# Patient Record
Sex: Female | Born: 1955
Health system: Southern US, Community
[De-identification: ages and names within clinical notes are randomized; demographics above are authoritative.]

---

## 2011-06-22 ENCOUNTER — Other Ambulatory Visit: Payer: Self-pay | Admitting: Family Medicine

## 2011-06-22 DIAGNOSIS — Z1231 Encounter for screening mammogram for malignant neoplasm of breast: Secondary | ICD-10-CM

## 2011-07-01 ENCOUNTER — Ambulatory Visit
Admission: RE | Admit: 2011-07-01 | Discharge: 2011-07-01 | Disposition: A | Discharge status: Home / Self Care | Payer: BC Managed Care – PPO | Source: Ambulatory Visit | Attending: Family Medicine | Admitting: Family Medicine

## 2011-07-01 DIAGNOSIS — Z1231 Encounter for screening mammogram for malignant neoplasm of breast: Secondary | ICD-10-CM

## 2011-07-13 ENCOUNTER — Other Ambulatory Visit (HOSPITAL_COMMUNITY)
Admission: RE | Admit: 2011-07-13 | Discharge: 2011-07-13 | Disposition: A | Discharge status: Home / Self Care | Payer: BC Managed Care – PPO | Source: Ambulatory Visit | Attending: Family Medicine | Admitting: Family Medicine

## 2011-07-13 ENCOUNTER — Other Ambulatory Visit: Payer: Self-pay | Admitting: Family Medicine

## 2011-07-13 DIAGNOSIS — Z124 Encounter for screening for malignant neoplasm of cervix: Secondary | ICD-10-CM | POA: Insufficient documentation

## 2011-07-13 DIAGNOSIS — Z1159 Encounter for screening for other viral diseases: Secondary | ICD-10-CM | POA: Insufficient documentation

## 2012-07-23 ENCOUNTER — Other Ambulatory Visit: Payer: Self-pay | Admitting: Family Medicine

## 2012-07-23 DIAGNOSIS — Z1231 Encounter for screening mammogram for malignant neoplasm of breast: Secondary | ICD-10-CM

## 2012-08-27 ENCOUNTER — Ambulatory Visit
Admission: RE | Admit: 2012-08-27 | Discharge: 2012-08-27 | Disposition: A | Discharge status: Home / Self Care | Payer: BC Managed Care – PPO | Source: Ambulatory Visit | Attending: Family Medicine | Admitting: Family Medicine

## 2012-08-27 DIAGNOSIS — Z1231 Encounter for screening mammogram for malignant neoplasm of breast: Secondary | ICD-10-CM

## 2013-09-25 ENCOUNTER — Other Ambulatory Visit: Payer: Self-pay

## 2013-09-25 DIAGNOSIS — Z1231 Encounter for screening mammogram for malignant neoplasm of breast: Secondary | ICD-10-CM

## 2013-10-11 ENCOUNTER — Ambulatory Visit
Admission: RE | Admit: 2013-10-11 | Discharge: 2013-10-11 | Disposition: A | Discharge status: Home / Self Care | Payer: BC Managed Care – PPO | Source: Ambulatory Visit

## 2013-10-11 DIAGNOSIS — Z1231 Encounter for screening mammogram for malignant neoplasm of breast: Secondary | ICD-10-CM

## 2014-04-21 ENCOUNTER — Other Ambulatory Visit: Payer: Self-pay | Admitting: Family Medicine

## 2014-04-21 ENCOUNTER — Other Ambulatory Visit (HOSPITAL_COMMUNITY)
Admission: RE | Admit: 2014-04-21 | Discharge: 2014-04-21 | Disposition: A | Discharge status: Home / Self Care | Payer: BC Managed Care – PPO | Source: Ambulatory Visit | Attending: Family Medicine | Admitting: Family Medicine

## 2014-04-21 DIAGNOSIS — Z1151 Encounter for screening for human papillomavirus (HPV): Secondary | ICD-10-CM | POA: Insufficient documentation

## 2014-04-21 DIAGNOSIS — Z124 Encounter for screening for malignant neoplasm of cervix: Secondary | ICD-10-CM | POA: Insufficient documentation

## 2014-04-23 LAB — CYTOLOGY - PAP

## 2014-11-26 ENCOUNTER — Other Ambulatory Visit: Payer: Self-pay

## 2014-11-26 DIAGNOSIS — Z1231 Encounter for screening mammogram for malignant neoplasm of breast: Secondary | ICD-10-CM

## 2014-12-03 ENCOUNTER — Ambulatory Visit
Admission: RE | Admit: 2014-12-03 | Discharge: 2014-12-03 | Disposition: A | Discharge status: Home / Self Care | Payer: BLUE CROSS/BLUE SHIELD | Source: Ambulatory Visit

## 2014-12-03 DIAGNOSIS — Z1231 Encounter for screening mammogram for malignant neoplasm of breast: Secondary | ICD-10-CM

## 2014-12-04 ENCOUNTER — Other Ambulatory Visit: Payer: Self-pay | Admitting: Family Medicine

## 2014-12-04 DIAGNOSIS — R928 Other abnormal and inconclusive findings on diagnostic imaging of breast: Secondary | ICD-10-CM

## 2014-12-15 ENCOUNTER — Ambulatory Visit
Admission: RE | Admit: 2014-12-15 | Discharge: 2014-12-15 | Disposition: A | Discharge status: Home / Self Care | Payer: BLUE CROSS/BLUE SHIELD | Source: Ambulatory Visit | Attending: Family Medicine | Admitting: Family Medicine

## 2014-12-15 DIAGNOSIS — R928 Other abnormal and inconclusive findings on diagnostic imaging of breast: Secondary | ICD-10-CM

## 2016-03-15 ENCOUNTER — Other Ambulatory Visit: Payer: Self-pay | Admitting: Family Medicine

## 2016-03-15 DIAGNOSIS — Z1231 Encounter for screening mammogram for malignant neoplasm of breast: Secondary | ICD-10-CM

## 2016-03-18 ENCOUNTER — Ambulatory Visit
Admission: RE | Admit: 2016-03-18 | Discharge: 2016-03-18 | Disposition: A | Discharge status: Home / Self Care | Payer: BLUE CROSS/BLUE SHIELD | Source: Ambulatory Visit | Attending: Family Medicine | Admitting: Family Medicine

## 2016-03-18 DIAGNOSIS — Z1231 Encounter for screening mammogram for malignant neoplasm of breast: Secondary | ICD-10-CM

## 2016-03-22 ENCOUNTER — Other Ambulatory Visit: Payer: Self-pay | Admitting: Family Medicine

## 2016-03-22 DIAGNOSIS — R928 Other abnormal and inconclusive findings on diagnostic imaging of breast: Secondary | ICD-10-CM

## 2016-04-20 ENCOUNTER — Ambulatory Visit
Admission: RE | Admit: 2016-04-20 | Discharge: 2016-04-20 | Disposition: A | Discharge status: Home / Self Care | Payer: BLUE CROSS/BLUE SHIELD | Source: Ambulatory Visit | Attending: Family Medicine | Admitting: Family Medicine

## 2016-04-20 DIAGNOSIS — R928 Other abnormal and inconclusive findings on diagnostic imaging of breast: Secondary | ICD-10-CM

## 2016-04-20 DIAGNOSIS — R922 Inconclusive mammogram: Secondary | ICD-10-CM | POA: Diagnosis not present

## 2016-05-03 DIAGNOSIS — Z131 Encounter for screening for diabetes mellitus: Secondary | ICD-10-CM | POA: Diagnosis not present

## 2016-05-03 DIAGNOSIS — Z0000 Encounter for general adult medical examination without abnormal findings: Secondary | ICD-10-CM | POA: Diagnosis not present

## 2016-05-03 DIAGNOSIS — Z1159 Encounter for screening for other viral diseases: Secondary | ICD-10-CM | POA: Diagnosis not present

## 2016-05-03 DIAGNOSIS — Z13220 Encounter for screening for lipoid disorders: Secondary | ICD-10-CM | POA: Diagnosis not present

## 2017-01-25 DIAGNOSIS — S20461A Insect bite (nonvenomous) of right back wall of thorax, initial encounter: Secondary | ICD-10-CM | POA: Diagnosis not present

## 2017-03-15 ENCOUNTER — Other Ambulatory Visit (HOSPITAL_COMMUNITY)
Admission: RE | Admit: 2017-03-15 | Discharge: 2017-03-15 | Disposition: A | Discharge status: Home / Self Care | Payer: BLUE CROSS/BLUE SHIELD | Source: Ambulatory Visit | Attending: Family Medicine | Admitting: Family Medicine

## 2017-03-15 ENCOUNTER — Other Ambulatory Visit: Payer: Self-pay | Admitting: Family Medicine

## 2017-03-15 DIAGNOSIS — Z131 Encounter for screening for diabetes mellitus: Secondary | ICD-10-CM | POA: Diagnosis not present

## 2017-03-15 DIAGNOSIS — Z13220 Encounter for screening for lipoid disorders: Secondary | ICD-10-CM | POA: Diagnosis not present

## 2017-03-15 DIAGNOSIS — Z124 Encounter for screening for malignant neoplasm of cervix: Secondary | ICD-10-CM | POA: Diagnosis not present

## 2017-03-15 DIAGNOSIS — Z0000 Encounter for general adult medical examination without abnormal findings: Secondary | ICD-10-CM | POA: Diagnosis not present

## 2017-03-20 LAB — CYTOLOGY - PAP
Diagnosis: NEGATIVE
HPV: NOT DETECTED

## 2017-05-15 ENCOUNTER — Other Ambulatory Visit: Payer: Self-pay | Admitting: Family Medicine

## 2017-05-15 DIAGNOSIS — Z1231 Encounter for screening mammogram for malignant neoplasm of breast: Secondary | ICD-10-CM

## 2017-05-19 ENCOUNTER — Ambulatory Visit
Admission: RE | Admit: 2017-05-19 | Discharge: 2017-05-19 | Disposition: A | Discharge status: Home / Self Care | Payer: BLUE CROSS/BLUE SHIELD | Source: Ambulatory Visit | Attending: Family Medicine | Admitting: Family Medicine

## 2017-05-19 DIAGNOSIS — Z1231 Encounter for screening mammogram for malignant neoplasm of breast: Secondary | ICD-10-CM | POA: Diagnosis not present

## 2018-03-26 DIAGNOSIS — Z0000 Encounter for general adult medical examination without abnormal findings: Secondary | ICD-10-CM | POA: Diagnosis not present

## 2018-03-26 DIAGNOSIS — Z13220 Encounter for screening for lipoid disorders: Secondary | ICD-10-CM | POA: Diagnosis not present

## 2018-03-26 DIAGNOSIS — Z131 Encounter for screening for diabetes mellitus: Secondary | ICD-10-CM | POA: Diagnosis not present

## 2018-07-04 ENCOUNTER — Other Ambulatory Visit: Payer: Self-pay | Admitting: Family Medicine

## 2018-07-04 DIAGNOSIS — Z1231 Encounter for screening mammogram for malignant neoplasm of breast: Secondary | ICD-10-CM

## 2018-08-17 ENCOUNTER — Ambulatory Visit
Admission: RE | Admit: 2018-08-17 | Discharge: 2018-08-17 | Disposition: A | Discharge status: Home / Self Care | Payer: BLUE CROSS/BLUE SHIELD | Source: Ambulatory Visit | Attending: Family Medicine | Admitting: Family Medicine

## 2018-08-17 DIAGNOSIS — Z1231 Encounter for screening mammogram for malignant neoplasm of breast: Secondary | ICD-10-CM | POA: Diagnosis not present

## 2019-04-08 DIAGNOSIS — Z23 Encounter for immunization: Secondary | ICD-10-CM | POA: Diagnosis not present

## 2019-04-08 DIAGNOSIS — Z131 Encounter for screening for diabetes mellitus: Secondary | ICD-10-CM | POA: Diagnosis not present

## 2019-04-08 DIAGNOSIS — Z0000 Encounter for general adult medical examination without abnormal findings: Secondary | ICD-10-CM | POA: Diagnosis not present

## 2019-04-08 DIAGNOSIS — E7800 Pure hypercholesterolemia, unspecified: Secondary | ICD-10-CM | POA: Diagnosis not present

## 2020-04-14 ENCOUNTER — Other Ambulatory Visit: Payer: Self-pay | Admitting: Family Medicine

## 2020-04-14 DIAGNOSIS — Z1231 Encounter for screening mammogram for malignant neoplasm of breast: Secondary | ICD-10-CM

## 2020-04-15 ENCOUNTER — Other Ambulatory Visit: Payer: Self-pay | Admitting: Family Medicine

## 2020-04-15 ENCOUNTER — Other Ambulatory Visit (HOSPITAL_COMMUNITY)
Admission: RE | Admit: 2020-04-15 | Discharge: 2020-04-15 | Disposition: A | Discharge status: Home / Self Care | Payer: BC Managed Care – PPO | Source: Ambulatory Visit | Attending: Family Medicine | Admitting: Family Medicine

## 2020-04-15 DIAGNOSIS — Z124 Encounter for screening for malignant neoplasm of cervix: Secondary | ICD-10-CM | POA: Diagnosis not present

## 2020-04-15 DIAGNOSIS — Z13220 Encounter for screening for lipoid disorders: Secondary | ICD-10-CM | POA: Diagnosis not present

## 2020-04-15 DIAGNOSIS — Z0000 Encounter for general adult medical examination without abnormal findings: Secondary | ICD-10-CM | POA: Diagnosis not present

## 2020-04-15 DIAGNOSIS — Z131 Encounter for screening for diabetes mellitus: Secondary | ICD-10-CM | POA: Diagnosis not present

## 2020-04-16 ENCOUNTER — Other Ambulatory Visit: Payer: Self-pay

## 2020-04-16 ENCOUNTER — Ambulatory Visit
Admission: RE | Admit: 2020-04-16 | Discharge: 2020-04-16 | Disposition: A | Discharge status: Home / Self Care | Payer: BLUE CROSS/BLUE SHIELD | Source: Ambulatory Visit | Attending: Family Medicine | Admitting: Family Medicine

## 2020-04-16 DIAGNOSIS — Z1231 Encounter for screening mammogram for malignant neoplasm of breast: Secondary | ICD-10-CM

## 2020-04-20 LAB — CYTOLOGY - PAP
Comment: NEGATIVE
Diagnosis: NEGATIVE
High risk HPV: NEGATIVE

## 2020-05-18 DIAGNOSIS — C44719 Basal cell carcinoma of skin of left lower limb, including hip: Secondary | ICD-10-CM | POA: Diagnosis not present

## 2020-05-18 DIAGNOSIS — D485 Neoplasm of uncertain behavior of skin: Secondary | ICD-10-CM | POA: Diagnosis not present

## 2020-05-18 DIAGNOSIS — L570 Actinic keratosis: Secondary | ICD-10-CM | POA: Diagnosis not present

## 2020-05-18 DIAGNOSIS — L821 Other seborrheic keratosis: Secondary | ICD-10-CM | POA: Diagnosis not present

## 2020-05-18 DIAGNOSIS — D225 Melanocytic nevi of trunk: Secondary | ICD-10-CM | POA: Diagnosis not present

## 2020-05-18 DIAGNOSIS — L578 Other skin changes due to chronic exposure to nonionizing radiation: Secondary | ICD-10-CM | POA: Diagnosis not present

## 2020-05-18 DIAGNOSIS — B353 Tinea pedis: Secondary | ICD-10-CM | POA: Diagnosis not present

## 2020-07-15 DIAGNOSIS — C44719 Basal cell carcinoma of skin of left lower limb, including hip: Secondary | ICD-10-CM | POA: Diagnosis not present

## 2021-03-17 DIAGNOSIS — H3561 Retinal hemorrhage, right eye: Secondary | ICD-10-CM | POA: Diagnosis not present

## 2021-04-28 DIAGNOSIS — Z0000 Encounter for general adult medical examination without abnormal findings: Secondary | ICD-10-CM | POA: Diagnosis not present

## 2021-04-28 DIAGNOSIS — Z131 Encounter for screening for diabetes mellitus: Secondary | ICD-10-CM | POA: Diagnosis not present

## 2021-04-28 DIAGNOSIS — Z23 Encounter for immunization: Secondary | ICD-10-CM | POA: Diagnosis not present

## 2021-04-28 DIAGNOSIS — Z13220 Encounter for screening for lipoid disorders: Secondary | ICD-10-CM | POA: Diagnosis not present

## 2021-04-30 ENCOUNTER — Other Ambulatory Visit: Payer: Self-pay | Admitting: Family Medicine

## 2021-04-30 DIAGNOSIS — Z1231 Encounter for screening mammogram for malignant neoplasm of breast: Secondary | ICD-10-CM

## 2021-05-24 DIAGNOSIS — Z808 Family history of malignant neoplasm of other organs or systems: Secondary | ICD-10-CM | POA: Diagnosis not present

## 2021-05-24 DIAGNOSIS — Z85828 Personal history of other malignant neoplasm of skin: Secondary | ICD-10-CM | POA: Diagnosis not present

## 2021-05-24 DIAGNOSIS — L821 Other seborrheic keratosis: Secondary | ICD-10-CM | POA: Diagnosis not present

## 2021-05-24 DIAGNOSIS — L570 Actinic keratosis: Secondary | ICD-10-CM | POA: Diagnosis not present

## 2021-05-24 DIAGNOSIS — L820 Inflamed seborrheic keratosis: Secondary | ICD-10-CM | POA: Diagnosis not present

## 2021-06-02 ENCOUNTER — Other Ambulatory Visit: Payer: Self-pay

## 2021-06-02 ENCOUNTER — Ambulatory Visit
Admission: RE | Admit: 2021-06-02 | Discharge: 2021-06-02 | Disposition: A | Discharge status: Home / Self Care | Payer: BC Managed Care – PPO | Source: Ambulatory Visit | Attending: Family Medicine | Admitting: Family Medicine

## 2021-06-02 DIAGNOSIS — Z1231 Encounter for screening mammogram for malignant neoplasm of breast: Secondary | ICD-10-CM | POA: Diagnosis not present

## 2021-06-30 DIAGNOSIS — H3561 Retinal hemorrhage, right eye: Secondary | ICD-10-CM | POA: Diagnosis not present

## 2021-07-27 ENCOUNTER — Other Ambulatory Visit (HOSPITAL_BASED_OUTPATIENT_CLINIC_OR_DEPARTMENT_OTHER): Payer: Self-pay | Admitting: Family Medicine

## 2021-07-27 DIAGNOSIS — H3561 Retinal hemorrhage, right eye: Secondary | ICD-10-CM

## 2021-08-06 ENCOUNTER — Other Ambulatory Visit (HOSPITAL_COMMUNITY): Payer: Self-pay | Admitting: Family Medicine

## 2021-08-06 ENCOUNTER — Ambulatory Visit (HOSPITAL_COMMUNITY)
Admission: RE | Admit: 2021-08-06 | Discharge: 2021-08-06 | Disposition: A | Discharge status: Home / Self Care | Payer: BC Managed Care – PPO | Source: Ambulatory Visit | Attending: Internal Medicine | Admitting: Internal Medicine

## 2021-08-06 ENCOUNTER — Other Ambulatory Visit: Payer: Self-pay

## 2021-08-06 DIAGNOSIS — H3561 Retinal hemorrhage, right eye: Secondary | ICD-10-CM

## 2021-08-24 ENCOUNTER — Other Ambulatory Visit: Payer: Self-pay

## 2021-08-24 ENCOUNTER — Ambulatory Visit: Payer: BC Managed Care – PPO | Admitting: Internal Medicine

## 2021-08-24 ENCOUNTER — Encounter: Payer: Self-pay | Admitting: Internal Medicine

## 2021-08-24 VITALS — BP 122/76 | HR 69 | Ht 68.0 in | Wt 170.6 lb

## 2021-08-24 DIAGNOSIS — I447 Left bundle-branch block, unspecified: Secondary | ICD-10-CM

## 2021-08-24 NOTE — Progress Notes (Signed)
Cardiology Office Note:    Date:  08/24/2021   ID:  Brandi Alvarez, DOB 10/17/1955, MRN [id number]  PCP:  Brandi Heron, MD   [facility] Providers Cardiologist:  Maisie Fus, MD     Referring MD: Brandi Heron, MD   No chief complaint on file. ?Roth spots  History of Present Illness:    Brandi Alvarez is a 66 y.o. female with a hx of concern of possible retinal hemorrhage/roth spots  Per patient, the referral note said she had roth spots. She saw the retinal specialist and there was low concern. There is not extensive information about her studies.  She denies any cardiac symptoms. She is active. She denies any infectious symptoms at all.  She denies smoking. No family hx of heart disease. She has normal blood pressure. No drug use.  Cardiology Studies: 08/06/2021: 1-39% stenosis right ICA, no stenosis in the left ICA indication retinal hemorrhage (low risk)   Current Medications: No outpatient medications have been marked as taking for the 08/24/21 encounter (Office Visit) with Maisie Fus, MD.     Allergies:   Patient has no allergy information on record.   Social History   Socioeconomic History   Marital status: Married    Spouse name: Not on file   Number of children: Not on file   Years of education: Not on file   Highest education level: Not on file  Occupational History   Not on file  Tobacco Use   Smoking status: Never    Passive exposure: Never   Smokeless tobacco: Never  Substance and Sexual Activity   Alcohol use: Not Currently   Drug use: Never   Sexual activity: Yes  Other Topics Concern   Not on file  Social History Narrative   Not on file   Social Determinants of Health   Financial Resource Strain: Not on file  Food Insecurity: Not on file  Transportation Needs: Not on file  Physical Activity: Not on file  Stress: Not on file  Social Connections: Not on file     Family History: The patient's family history includes  Breast cancer in her mother.  ROS:   Please see the history of present illness.     All other systems reviewed and are negative.  EKGs/Labs/Other Studies Reviewed:    The following studies were reviewed today:   EKG:  EKG is  ordered today.  The ekg ordered today demonstrates   NSR, LBBB QRS 132 ms  Recent Labs: No results found for requested labs within last 8760 hours.  Recent Lipid Panel No results found for: CHOL, TRIG, HDL, CHOLHDL, VLDL, LDLCALC, LDLDIRECT   Risk Assessment/Calculations:         Physical Exam:    VS:  Vitals:   08/24/21 1350  BP: 122/76  Pulse: 69  SpO2: 98%     Wt Readings from Last 3 Encounters:  08/24/21 170 lb 9.6 oz (77.4 kg)     GEN:  Well nourished, well developed in no acute distress HEENT: Normal NECK: No JVD; No carotid bruits LYMPHATICS: No lymphadenopathy CARDIAC: RRR, no murmurs, rubs, gallops RESPIRATORY:  Clear to auscultation without rales, wheezing or rhonchi  ABDOMEN: Soft, non-tender, non-distended MUSCULOSKELETAL:  No edema; No deformity  SKIN: Warm and dry NEUROLOGIC:  Alert and oriented x 3 PSYCHIATRIC:  Normal affect   ASSESSMENT:    #Roth spot?: No signs of IE. No hx of prosthetic valve or IVUD. Does have LBBB, no  prior EKG to compare. She is completely asymptomatic. Her risk of ASCVD is lo 3.9%. Her blood pressure is well controlled. She is a non smoker, no hx of DM2. She has LBBB, no prior EKGs to compare. It is unclear what the findings were with her ophthalamologist. With LBBB and concerin for embolic source will opt to proceed with TTE   PLAN:    In order of problems listed above:  TTE Follow up as needed           Medication Adjustments/Labs and Tests Ordered: Current medicines are reviewed at length with the patient today.  Concerns regarding medicines are outlined above.  Orders Placed This Encounter  Procedures   ECHOCARDIOGRAM COMPLETE   No orders of the defined types were placed in  this encounter.   Patient Instructions  Medication Instructions:  No Changes In Medications at this time.  *If you need a refill on your cardiac medications before your next appointment, please call your pharmacy*  Your physician has requested that you have an echocardiogram. Echocardiography is a painless test that uses sound waves to create images of your heart. It provides your doctor with information about the size and shape of your heart and how well your hearts chambers and valves are working. You may receive an ultrasound enhancing agent through an IV if needed to better visualize your heart during the echo.This procedure takes approximately one hour. There are no restrictions for this procedure. This will take place at the [address], [address].   Follow-Up: At [facility], you and your health needs are our priority.  As part of our continuing mission to provide you with exceptional heart care, we have created designated Provider Care Teams.  These Care Teams include your primary Cardiologist (physician) and Advanced Practice Providers (APPs -  Physician Assistants and Nurse Practitioners) who all work together to provide you with the care you need, when you need it.  Your next appointment:   AS NEEDED   The format for your next appointment:   In Person  Provider:   Maisie Fus, MD     Signed, Maisie Fus, MD  08/24/2021 6:30 PM    [facility]

## 2021-08-24 NOTE — Patient Instructions (Signed)
Medication Instructions:  No Changes In Medications at this time.  *If you need a refill on your cardiac medications before your next appointment, please call your pharmacy*  Your physician has requested that you have an echocardiogram. Echocardiography is a painless test that uses sound waves to create images of your heart. It provides your doctor with information about the size and shape of your heart and how well your hearts chambers and valves are working. You may receive an ultrasound enhancing agent through an IV if needed to better visualize your heart during the echo.This procedure takes approximately one hour. There are no restrictions for this procedure. This will take place at the [address], [address].   Follow-Up: At [facility], you and your health needs are our priority.  As part of our continuing mission to provide you with exceptional heart care, we have created designated Provider Care Teams.  These Care Teams include your primary Cardiologist (physician) and Advanced Practice Providers (APPs -  Physician Assistants and Nurse Practitioners) who all work together to provide you with the care you need, when you need it.  Your next appointment:   AS NEEDED   The format for your next appointment:   In Person  Provider:   Janina Mayo, MD

## 2021-08-30 NOTE — Addendum Note (Signed)
Addended by: Myna Hidalgo A on: 08/30/2021 02:23 PM   Modules accepted: Orders

## 2021-09-06 ENCOUNTER — Telehealth (HOSPITAL_COMMUNITY): Payer: Self-pay | Admitting: Internal Medicine

## 2021-09-06 NOTE — Telephone Encounter (Signed)
Patient called and cancelled echocardiogram scheduled and dis not wish to reschedule at this time. Order will be removed from the echo WQ and if patient calls back to reschedule we will reinstate the order.

## 2021-09-08 ENCOUNTER — Other Ambulatory Visit (HOSPITAL_COMMUNITY): Payer: BC Managed Care – PPO

## 2021-11-05 DIAGNOSIS — Z23 Encounter for immunization: Secondary | ICD-10-CM | POA: Diagnosis not present

## 2022-05-05 DIAGNOSIS — R7303 Prediabetes: Secondary | ICD-10-CM | POA: Diagnosis not present

## 2022-05-05 DIAGNOSIS — Z0000 Encounter for general adult medical examination without abnormal findings: Secondary | ICD-10-CM | POA: Diagnosis not present

## 2022-05-05 DIAGNOSIS — Z23 Encounter for immunization: Secondary | ICD-10-CM | POA: Diagnosis not present

## 2022-05-05 DIAGNOSIS — Z13220 Encounter for screening for lipoid disorders: Secondary | ICD-10-CM | POA: Diagnosis not present

## 2022-08-17 DIAGNOSIS — R21 Rash and other nonspecific skin eruption: Secondary | ICD-10-CM | POA: Diagnosis not present

## 2022-08-17 DIAGNOSIS — Z129 Encounter for screening for malignant neoplasm, site unspecified: Secondary | ICD-10-CM | POA: Diagnosis not present

## 2022-08-17 DIAGNOSIS — Z85828 Personal history of other malignant neoplasm of skin: Secondary | ICD-10-CM | POA: Diagnosis not present

## 2022-08-17 DIAGNOSIS — Z808 Family history of malignant neoplasm of other organs or systems: Secondary | ICD-10-CM | POA: Diagnosis not present

## 2022-08-17 DIAGNOSIS — C44519 Basal cell carcinoma of skin of other part of trunk: Secondary | ICD-10-CM | POA: Diagnosis not present

## 2022-08-17 DIAGNOSIS — D485 Neoplasm of uncertain behavior of skin: Secondary | ICD-10-CM | POA: Diagnosis not present

## 2022-09-12 DIAGNOSIS — C44519 Basal cell carcinoma of skin of other part of trunk: Secondary | ICD-10-CM | POA: Diagnosis not present

## 2022-09-15 ENCOUNTER — Other Ambulatory Visit: Payer: Self-pay | Admitting: Family Medicine

## 2022-09-15 DIAGNOSIS — Z1231 Encounter for screening mammogram for malignant neoplasm of breast: Secondary | ICD-10-CM

## 2022-09-20 ENCOUNTER — Ambulatory Visit
Admission: RE | Admit: 2022-09-20 | Discharge: 2022-09-20 | Disposition: A | Discharge status: Home / Self Care | Payer: BC Managed Care – PPO | Source: Ambulatory Visit | Attending: Family Medicine | Admitting: Family Medicine

## 2022-09-20 DIAGNOSIS — Z1231 Encounter for screening mammogram for malignant neoplasm of breast: Secondary | ICD-10-CM

## 2022-11-29 DIAGNOSIS — Z1211 Encounter for screening for malignant neoplasm of colon: Secondary | ICD-10-CM | POA: Diagnosis not present

## 2022-11-29 DIAGNOSIS — K5730 Diverticulosis of large intestine without perforation or abscess without bleeding: Secondary | ICD-10-CM | POA: Diagnosis not present

## 2022-11-29 DIAGNOSIS — K644 Residual hemorrhoidal skin tags: Secondary | ICD-10-CM | POA: Diagnosis not present

## 2022-11-29 DIAGNOSIS — D124 Benign neoplasm of descending colon: Secondary | ICD-10-CM | POA: Diagnosis not present

## 2022-11-29 DIAGNOSIS — D120 Benign neoplasm of cecum: Secondary | ICD-10-CM | POA: Diagnosis not present

## 2022-11-29 DIAGNOSIS — D123 Benign neoplasm of transverse colon: Secondary | ICD-10-CM | POA: Diagnosis not present

## 2022-11-29 DIAGNOSIS — K648 Other hemorrhoids: Secondary | ICD-10-CM | POA: Diagnosis not present

## 2023-03-15 DIAGNOSIS — C44519 Basal cell carcinoma of skin of other part of trunk: Secondary | ICD-10-CM | POA: Diagnosis not present

## 2023-04-16 IMAGING — MG MM DIGITAL SCREENING BILAT W/ TOMO AND CAD
8 series · 8 of 24 positions shown · non-contrast
Comparison: Previous exam(s).

CLINICAL DATA: Screening.

EXAM:
DIGITAL SCREENING BILATERAL MAMMOGRAM WITH TOMOSYNTHESIS AND CAD
TECHNIQUE: Bilateral screening digital craniocaudal and mediolateral oblique
mammograms were obtained. Bilateral screening digital breast
tomosynthesis was performed. The images were evaluated with
computer-aided detection.

[L MLO synth-2D]
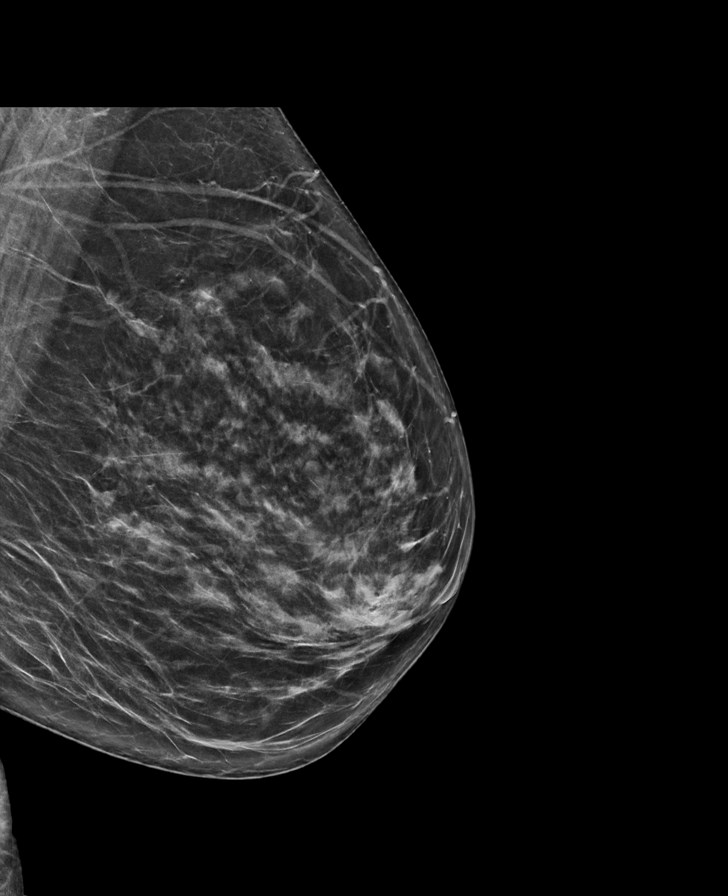

[R MLO synth-2D]
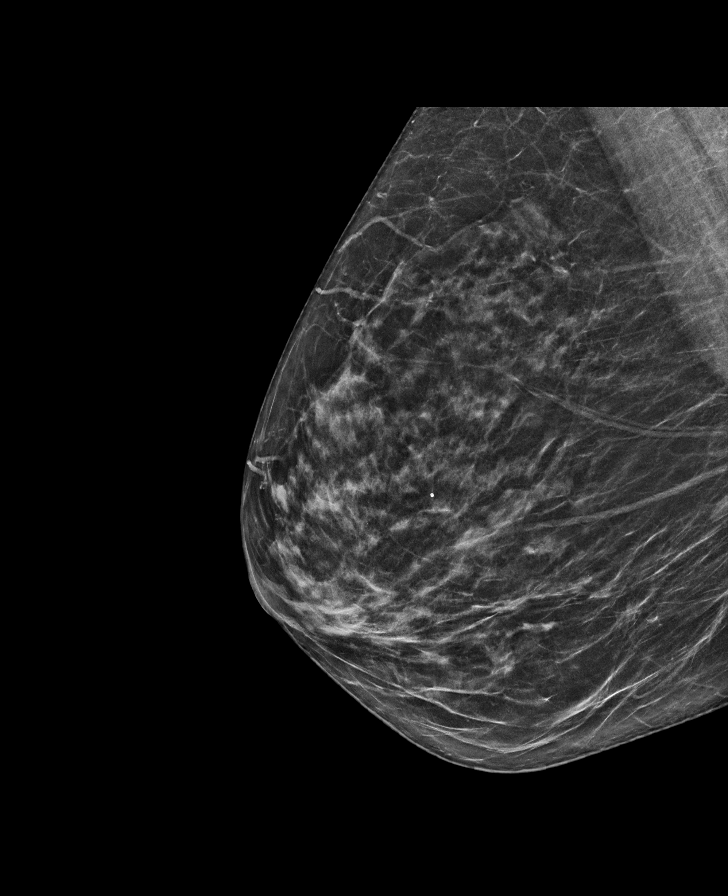

[L CC synth-2D]
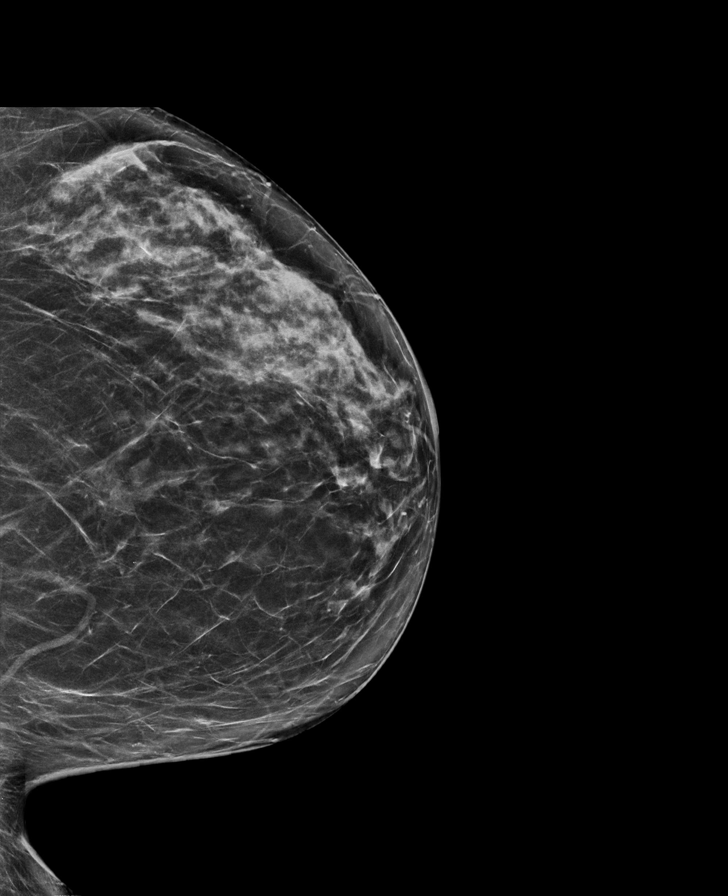

[R CC synth-2D]
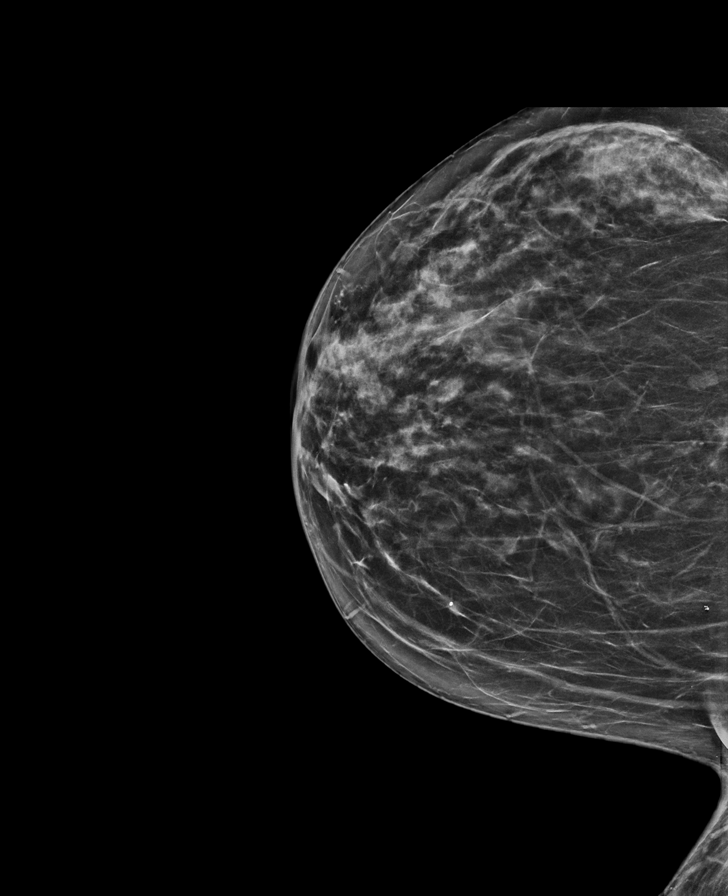

[R CC tomo · tomo slice 32/63.0]
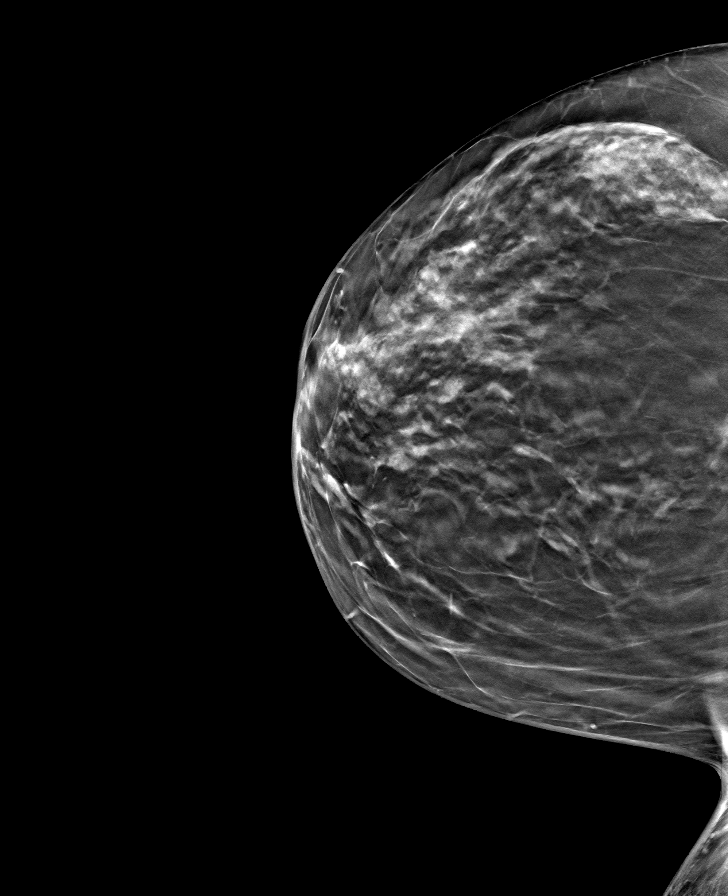

[L MLO tomo · tomo slice 36/71.0]
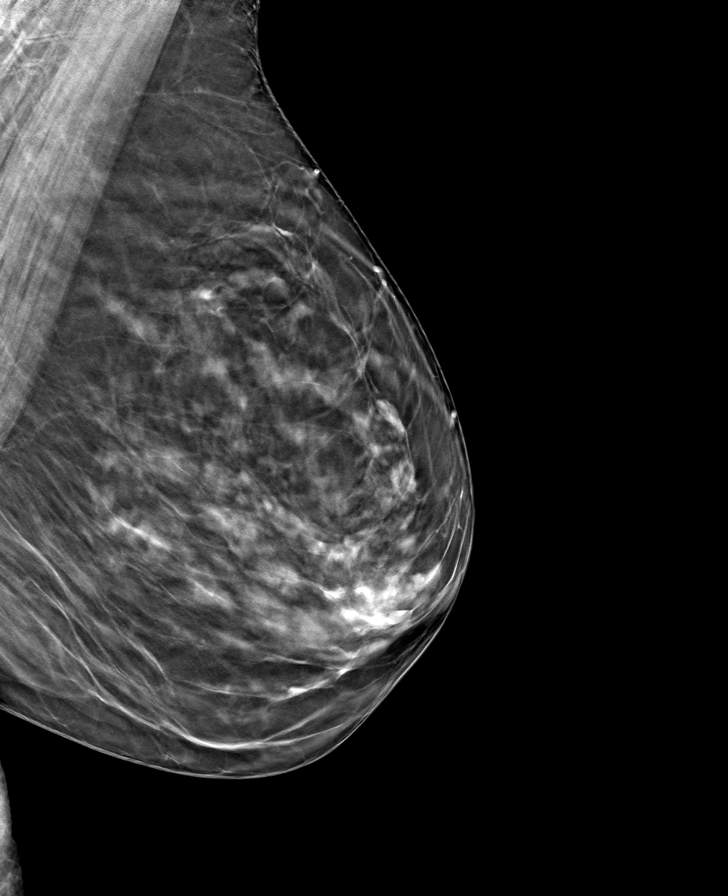

[R MLO tomo · tomo slice 35/69.0]
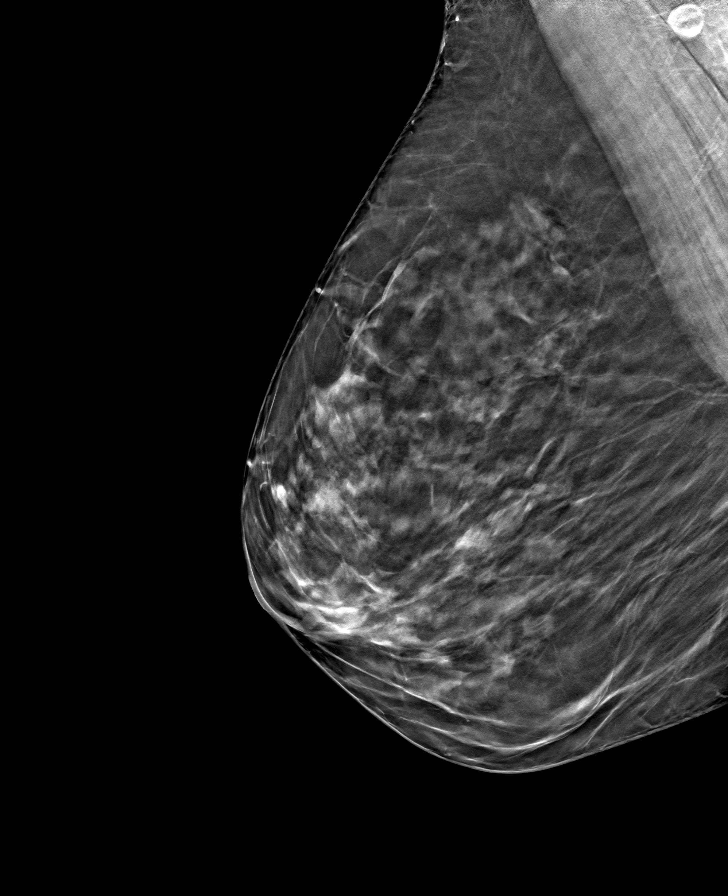

[L CC tomo · tomo slice 35/68.0]
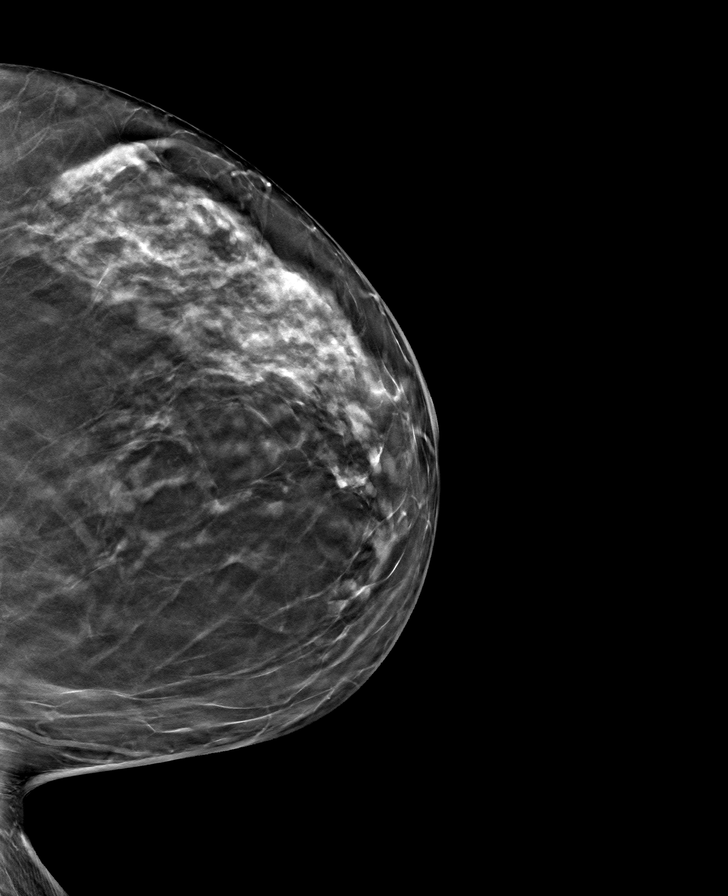

[8 of 24 positions shown; findings below may reference images not displayed]

ACR Breast Density Category c: The breast tissue is heterogeneously
dense, which may obscure small masses.
FINDINGS: There are no findings suspicious for malignancy.
IMPRESSION: No mammographic evidence of malignancy. A result letter of this
screening mammogram will be mailed directly to the patient.

RECOMMENDATION:
Screening mammogram in one year. (Code:Q3-W-BC3)

BI-RADS CATEGORY  1: Negative.

## 2023-05-12 ENCOUNTER — Other Ambulatory Visit: Payer: Self-pay | Admitting: Family Medicine

## 2023-05-12 DIAGNOSIS — R7303 Prediabetes: Secondary | ICD-10-CM | POA: Diagnosis not present

## 2023-05-12 DIAGNOSIS — Z0000 Encounter for general adult medical examination without abnormal findings: Secondary | ICD-10-CM | POA: Diagnosis not present

## 2023-05-12 DIAGNOSIS — Z780 Asymptomatic menopausal state: Secondary | ICD-10-CM

## 2023-05-12 DIAGNOSIS — Z23 Encounter for immunization: Secondary | ICD-10-CM | POA: Diagnosis not present

## 2023-10-06 ENCOUNTER — Other Ambulatory Visit: Payer: Self-pay | Admitting: Family Medicine

## 2023-10-06 DIAGNOSIS — Z1231 Encounter for screening mammogram for malignant neoplasm of breast: Secondary | ICD-10-CM

## 2023-10-26 ENCOUNTER — Ambulatory Visit
Admission: RE | Admit: 2023-10-26 | Discharge: 2023-10-26 | Disposition: A | Discharge status: Home / Self Care | Payer: BC Managed Care – PPO | Source: Ambulatory Visit | Attending: Family Medicine | Admitting: Family Medicine

## 2023-10-26 DIAGNOSIS — Z1231 Encounter for screening mammogram for malignant neoplasm of breast: Secondary | ICD-10-CM | POA: Diagnosis not present

## 2024-01-18 ENCOUNTER — Other Ambulatory Visit: Payer: BC Managed Care – PPO

## 2024-01-19 ENCOUNTER — Other Ambulatory Visit: Payer: Self-pay | Admitting: Family Medicine

## 2024-01-19 DIAGNOSIS — Z780 Asymptomatic menopausal state: Secondary | ICD-10-CM

## 2024-07-03 ENCOUNTER — Ambulatory Visit (HOSPITAL_BASED_OUTPATIENT_CLINIC_OR_DEPARTMENT_OTHER)
Admission: RE | Admit: 2024-07-03 | Discharge: 2024-07-03 | Disposition: A | Discharge status: Home / Self Care | Source: Ambulatory Visit | Attending: Family Medicine | Admitting: Family Medicine

## 2024-07-03 DIAGNOSIS — Z780 Asymptomatic menopausal state: Secondary | ICD-10-CM | POA: Diagnosis present
